# Patient Record
Sex: Female | Born: 1945 | Race: White | Hispanic: No | Marital: Married
Health system: Southern US, Community
[De-identification: ages and names within clinical notes are randomized; demographics above are authoritative.]

---

## 2000-12-13 ENCOUNTER — Encounter: Admission: RE | Admit: 2000-12-13 | Discharge: 2000-12-13 | Payer: Self-pay | Admitting: *Deleted

## 2000-12-13 ENCOUNTER — Encounter: Payer: Self-pay | Admitting: *Deleted

## 2000-12-25 ENCOUNTER — Encounter (INDEPENDENT_AMBULATORY_CARE_PROVIDER_SITE_OTHER): Payer: Self-pay

## 2000-12-25 ENCOUNTER — Other Ambulatory Visit: Admission: RE | Admit: 2000-12-25 | Discharge: 2000-12-25 | Payer: Self-pay | Admitting: Internal Medicine

## 2004-08-27 ENCOUNTER — Ambulatory Visit: Payer: Self-pay | Admitting: Family Medicine

## 2005-08-01 ENCOUNTER — Ambulatory Visit: Payer: Self-pay | Admitting: Family Medicine

## 2006-07-19 ENCOUNTER — Ambulatory Visit: Payer: Self-pay | Admitting: Internal Medicine

## 2006-08-08 ENCOUNTER — Ambulatory Visit: Payer: Self-pay | Admitting: Internal Medicine

## 2006-09-08 ENCOUNTER — Ambulatory Visit: Payer: Self-pay | Admitting: Internal Medicine

## 2006-09-08 LAB — CONVERTED CEMR LAB
ALT: 20 units/L (ref 0–40)
AST: 18 units/L (ref 0–37)
Albumin: 3.5 g/dL (ref 3.5–5.2)
Alkaline Phosphatase: 69 units/L (ref 39–117)
Chloride: 108 meq/L (ref 96–112)
Glomerular Filtration Rate, Af Am: 95 mL/min/{1.73_m2}
Glucose, Bld: 93 mg/dL (ref 70–99)
LDL Cholesterol: 147 mg/dL — ABNORMAL HIGH (ref 0–99)
MCV: 93 fL (ref 78.0–100.0)
Potassium: 4.5 meq/L (ref 3.5–5.1)
RDW: 11.4 % — ABNORMAL LOW (ref 11.5–14.6)
Sodium: 141 meq/L (ref 135–145)
Total Bilirubin: 0.8 mg/dL (ref 0.3–1.2)
Total Protein: 6.2 g/dL (ref 6.0–8.3)
Triglyceride fasting, serum: 68 mg/dL (ref 0–149)
VLDL: 14 mg/dL (ref 0–40)
WBC: 3.8 10*3/uL — ABNORMAL LOW (ref 4.5–10.5)

## 2006-12-08 ENCOUNTER — Ambulatory Visit (HOSPITAL_BASED_OUTPATIENT_CLINIC_OR_DEPARTMENT_OTHER): Admission: RE | Admit: 2006-12-08 | Discharge: 2006-12-08 | Payer: Self-pay | Admitting: General Surgery

## 2007-08-07 ENCOUNTER — Ambulatory Visit: Payer: Self-pay | Admitting: Internal Medicine

## 2007-08-07 DIAGNOSIS — M949 Disorder of cartilage, unspecified: Secondary | ICD-10-CM

## 2007-08-07 DIAGNOSIS — M899 Disorder of bone, unspecified: Secondary | ICD-10-CM | POA: Insufficient documentation

## 2007-08-23 ENCOUNTER — Telehealth (INDEPENDENT_AMBULATORY_CARE_PROVIDER_SITE_OTHER): Payer: Self-pay | Admitting: *Deleted

## 2007-09-21 ENCOUNTER — Ambulatory Visit: Payer: Self-pay | Admitting: Internal Medicine

## 2007-09-24 LAB — CONVERTED CEMR LAB
ALT: 26 units/L (ref 0–35)
Albumin: 3.6 g/dL (ref 3.5–5.2)
Alkaline Phosphatase: 70 units/L (ref 39–117)
Total Protein: 6.5 g/dL (ref 6.0–8.3)

## 2008-02-10 ENCOUNTER — Emergency Department (HOSPITAL_COMMUNITY): Admission: EM | Admit: 2008-02-10 | Discharge: 2008-02-11 | Payer: Self-pay | Admitting: Emergency Medicine

## 2008-02-11 ENCOUNTER — Telehealth (INDEPENDENT_AMBULATORY_CARE_PROVIDER_SITE_OTHER): Payer: Self-pay | Admitting: *Deleted

## 2008-02-12 ENCOUNTER — Ambulatory Visit: Payer: Self-pay | Admitting: Internal Medicine

## 2008-02-12 DIAGNOSIS — R55 Syncope and collapse: Secondary | ICD-10-CM | POA: Insufficient documentation

## 2008-02-18 ENCOUNTER — Telehealth (INDEPENDENT_AMBULATORY_CARE_PROVIDER_SITE_OTHER): Payer: Self-pay | Admitting: *Deleted

## 2008-02-21 ENCOUNTER — Encounter: Admission: RE | Admit: 2008-02-21 | Discharge: 2008-02-21 | Payer: Self-pay | Admitting: Internal Medicine

## 2008-02-26 ENCOUNTER — Encounter (INDEPENDENT_AMBULATORY_CARE_PROVIDER_SITE_OTHER): Payer: Self-pay | Admitting: *Deleted

## 2008-04-01 ENCOUNTER — Ambulatory Visit: Payer: Self-pay | Admitting: Internal Medicine

## 2008-04-07 ENCOUNTER — Encounter (INDEPENDENT_AMBULATORY_CARE_PROVIDER_SITE_OTHER): Payer: Self-pay | Admitting: *Deleted

## 2008-04-07 LAB — CONVERTED CEMR LAB
ALT: 22 units/L (ref 0–35)
AST: 19 units/L (ref 0–37)
Albumin: 3.7 g/dL (ref 3.5–5.2)
Basophils Absolute: 0 10*3/uL (ref 0.0–0.1)
Basophils Relative: 0.3 % (ref 0.0–1.0)
Eosinophils Absolute: 0 10*3/uL (ref 0.0–0.7)
Eosinophils Relative: 0.8 % (ref 0.0–5.0)
HCT: 39.8 % (ref 36.0–46.0)
MCHC: 34.5 g/dL (ref 30.0–36.0)
MCV: 92.4 fL (ref 78.0–100.0)
Monocytes Absolute: 0.4 10*3/uL (ref 0.1–1.0)
Neutro Abs: 2.9 10*3/uL (ref 1.4–7.7)
Neutrophils Relative %: 65.6 % (ref 43.0–77.0)
RBC: 4.31 M/uL (ref 3.87–5.11)
Sed Rate: 10 mm/hr (ref 0–22)
TSH: 2.45 microintl units/mL (ref 0.35–5.50)
Total Protein: 6.7 g/dL (ref 6.0–8.3)
WBC: 4.4 10*3/uL — ABNORMAL LOW (ref 4.5–10.5)

## 2008-08-12 ENCOUNTER — Ambulatory Visit: Payer: Self-pay | Admitting: Family Medicine

## 2008-12-09 ENCOUNTER — Encounter (INDEPENDENT_AMBULATORY_CARE_PROVIDER_SITE_OTHER): Payer: Self-pay | Admitting: *Deleted

## 2008-12-26 ENCOUNTER — Ambulatory Visit: Payer: Self-pay | Admitting: Internal Medicine

## 2008-12-26 DIAGNOSIS — J309 Allergic rhinitis, unspecified: Secondary | ICD-10-CM | POA: Insufficient documentation

## 2008-12-29 ENCOUNTER — Encounter (INDEPENDENT_AMBULATORY_CARE_PROVIDER_SITE_OTHER): Payer: Self-pay | Admitting: *Deleted

## 2009-01-14 ENCOUNTER — Ambulatory Visit: Payer: Self-pay | Admitting: Internal Medicine

## 2009-01-29 ENCOUNTER — Telehealth: Payer: Self-pay | Admitting: Internal Medicine

## 2009-01-30 ENCOUNTER — Ambulatory Visit: Payer: Self-pay | Admitting: Internal Medicine

## 2009-03-04 ENCOUNTER — Ambulatory Visit: Payer: Self-pay | Admitting: Internal Medicine

## 2009-03-13 ENCOUNTER — Encounter: Payer: Self-pay | Admitting: Internal Medicine

## 2009-04-10 ENCOUNTER — Telehealth (INDEPENDENT_AMBULATORY_CARE_PROVIDER_SITE_OTHER): Payer: Self-pay | Admitting: *Deleted

## 2009-09-11 ENCOUNTER — Ambulatory Visit: Payer: Self-pay | Admitting: Internal Medicine

## 2009-09-16 LAB — CONVERTED CEMR LAB
ALT: 20 units/L (ref 0–35)
BUN: 14 mg/dL (ref 6–23)
CO2: 29 meq/L (ref 19–32)
Chloride: 106 meq/L (ref 96–112)
Cholesterol: 217 mg/dL — ABNORMAL HIGH (ref 0–200)
Creatinine, Ser: 0.8 mg/dL (ref 0.4–1.2)
Eosinophils Absolute: 0 10*3/uL (ref 0.0–0.7)
Eosinophils Relative: 1.1 % (ref 0.0–5.0)
Glucose, Bld: 92 mg/dL (ref 70–99)
HCT: 41.5 % (ref 36.0–46.0)
Lymphs Abs: 1.1 10*3/uL (ref 0.7–4.0)
MCHC: 33 g/dL (ref 30.0–36.0)
MCV: 95.5 fL (ref 78.0–100.0)
Monocytes Absolute: 0.3 10*3/uL (ref 0.1–1.0)
Platelets: 216 10*3/uL (ref 150.0–400.0)
Potassium: 4.1 meq/L (ref 3.5–5.1)
TSH: 2.32 microintl units/mL (ref 0.35–5.50)
Total CHOL/HDL Ratio: 5
WBC: 3.8 10*3/uL — ABNORMAL LOW (ref 4.5–10.5)

## 2009-11-23 ENCOUNTER — Telehealth (INDEPENDENT_AMBULATORY_CARE_PROVIDER_SITE_OTHER): Payer: Self-pay | Admitting: *Deleted

## 2009-12-21 ENCOUNTER — Ambulatory Visit: Payer: Self-pay | Admitting: Internal Medicine

## 2009-12-21 DIAGNOSIS — F411 Generalized anxiety disorder: Secondary | ICD-10-CM | POA: Insufficient documentation

## 2010-09-07 ENCOUNTER — Telehealth: Payer: Self-pay | Admitting: Internal Medicine

## 2010-10-31 LAB — CONVERTED CEMR LAB
AST: 30 units/L (ref 0–37)
BUN: 12 mg/dL (ref 6–23)
Basophils Relative: 0.8 % (ref 0.0–1.0)
CO2: 29 meq/L (ref 19–32)
Creatinine, Ser: 0.8 mg/dL (ref 0.4–1.2)
Direct LDL: 145.7 mg/dL
Eosinophils Relative: 2.1 % (ref 0.0–5.0)
GFR calc Af Amer: 95 mL/min
Glucose, Bld: 95 mg/dL (ref 70–99)
HCT: 40 % (ref 36.0–46.0)
Hemoglobin: 13.8 g/dL (ref 12.0–15.0)
Lymphocytes Relative: 30.4 % (ref 12.0–46.0)
Monocytes Absolute: 0.2 10*3/uL (ref 0.2–0.7)
Neutro Abs: 1.9 10*3/uL (ref 1.4–7.7)
Potassium: 4 meq/L (ref 3.5–5.1)
RDW: 11.6 % (ref 11.5–14.6)
Total CHOL/HDL Ratio: 6
VLDL: 13 mg/dL (ref 0–40)
WBC: 3.2 10*3/uL — ABNORMAL LOW (ref 4.5–10.5)

## 2010-11-02 NOTE — Progress Notes (Signed)
Summary: Shingles Vaccine Rx  Phone Note Call from Patient Call back at Home Phone [phone]   Caller: Patient Summary of Call: Patient's wife called and LM on triage VM stating that their insurance will cover the shingles vaccine fully if they have it done at a pharmacy. She would like a written rx for the shingles vaccine to pick up on monday so they can go have it done. Please advise.  Initial call taken by: Harold Barban,  September 07, 2010 1:23 PM  Follow-up for Phone Call        please do Ramsey E. Paz MD  September 07, 2010 4:50 PM   Additional Follow-up for Phone Call Additional follow up Details #1::        placed up front for pt.  Additional Follow-up by: Army Fossa CMA,  September 07, 2010 4:59 PM    New/Updated Medications: * SHINGLES VACCINE please give pt 1 dose of Zostavax subcutaneously. Prescriptions: SHINGLES VACCINE please give pt 1 dose of Zostavax subcutaneously.  #1 x 0   Entered by:   Army Fossa CMA   Authorized by:   Nolon Rod. Paz MD   Signed by:   Army Fossa CMA on 09/07/2010   Method used:   Print then Give to Patient   RxID:   [id number]

## 2010-11-02 NOTE — Progress Notes (Signed)
Summary: Shingles vacc  Phone Note Call from Patient Call back at Home Phone [phone]   Summary of Call: Patient left message on triage noting that she is 75 and has been told by friends that they received the shingle vaccine at 65 yrs old. She would like to know if she could have hers. Initial call taken by: Lucious Groves CMA,  September 07, 2010 10:36 AM  Follow-up for Phone Call        Patient notfiied to check with her insurance and call back for appt. Follow-up by: Lucious Groves CMA,  September 07, 2010 11:31 AM

## 2010-11-02 NOTE — Progress Notes (Signed)
Summary:  antidepressant rx left msg for pt to call  Phone Note Call from Patient Call back at Home Phone [phone]   Caller: Patient Summary of Call: pt called c/o feeling "blue " very mild depression,  husband had major surgery, aortic anurism . Finds herself crying  at times, denies any  suicide ideas "Just cant get myself over the hump" Pt was  seen 09/11/10 for CPX,  wants to know if Dr Drue Novel could prescribe a mild antidepressant  Send to [facility] [address]   Pt would like  call when done.  Kandice Hams  November 23, 2009 3:13 PM  Initial call taken by: Kandice Hams,  November 23, 2009 3:25 PM  Follow-up for Phone Call        recommend to start Zoloft 50 mg half tablet daily for 10 days then one tablet daily call #30, no RF office visit in 4 weeks call if severe side effects: Nausea,  diarrhea. Also call if she has any suicidal ideas  Follow-up by: Jose E. Paz MD,  November 24, 2009 1:53 PM  Additional Follow-up for Phone Call Additional follow up Details #1::        Rx faxed ,  spoke with pt and ov has een scheduled for 1 month folllowup .Kandice Hams  November 24, 2009 2:28 PM   Additional Follow-up by: Kandice Hams,  November 24, 2009 2:28 PM    New/Updated Medications: ZOLOFT 50 MG TABS (SERTRALINE HCL) 1/2 tab  daily for 10 days, then 1 tablet daily Prescriptions: ZOLOFT 50 MG TABS (SERTRALINE HCL) 1/2 tab  daily for 10 days, then 1 tablet daily  #30 x 0   Entered by:   Kandice Hams   Authorized by:   Nolon Rod. Paz MD   Signed by:   Kandice Hams on 11/24/2009   Method used:   Faxed to ...       [facility] [address] [id number] (retail)       [address]       [place], [place]  [address]       Ph: [phone]       Fax: [phone]   RxID:   [id number]

## 2010-11-02 NOTE — Assessment & Plan Note (Signed)
Summary: 1 month followup on meds/alr   Vital Signs:  Patient profile:   65 year old female Weight:      177 pounds Pulse rate:   68 / minute BP sitting:   100 / 74  (left arm)  Vitals Entered By: Doristine Devoid (December 21, 2009 3:53 PM) CC: f/u on meds    History of Present Illness:  patient was recently started on Zoloft due to anxiety. anxiety  was due to her husband  health,  she also had an atypical mole that required a wide excision. several  other  things are going on  on  her life, thinking about retiring soon she responded so well to  25 mg of zoloft  that she decided to stay in that dose instead of going to 50 mg daily   Allergies: 1)  ! Pcn 2)  ! 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 breast Bx (-), in her 58s  Social History: Reviewed history from 09/11/2009 and no changes required. 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   Psych:  Oriented X3, memory intact for recent and remote, normally interactive, and good eye contact.  seems to be doing very well and emotionally    Impression & Recommendations:  Problem # 1:  ANXIETY (ICD-300.00) was started Zoloft due to anxiety, doing very well, see HPI and review of systems plan : Follow-up in 3 months, reassess need of cont. w/   medication but at the same time ok to go  up on Zoloft if  needed  Her updated medication list for this problem includes:    Zoloft 50 Mg Tabs (Sertraline hcl) .Marland Kitchen... 1/2 tab  daily  Problem # 2:  ALLERGIC RHINITIS (ICD-477.9) on Zyrtec p.r.n., okay to take with Zoloft Her updated medication list for this problem includes:    Zyrtec Allergy 10 Mg Tabs (Cetirizine hcl) .Marland Kitchen... 1 otc tablet daily as needed  Complete Medication List: 1)  Calcium and Vit D  2)  Mvi  3)  Biotin  4)  Zyrtec Allergy 10 Mg Tabs (Cetirizine hcl) .Marland Kitchen.. 1 otc tablet daily as needed 5)  Aspirin 81 Mg Tbec (Aspirin) .Marland Kitchen.. 1 a day 6)  Zoloft 50 Mg Tabs (Sertraline hcl) .... 1/2 tab  daily  Patient Instructions: 1)  Please schedule a follow-up appointment in 3 months .  Prescriptions: ZOLOFT 50 MG TABS (SERTRALINE HCL) 1/2 tab  daily  #30 x 2   Entered and Authorized by:   Leslie Rod. Corda Shutt MD   Signed by:   Leslie Rod. Etienne Mowers MD on 12/21/2009   Method used:   Print then Give to Patient   RxID:   [id number]

## 2011-01-17 ENCOUNTER — Telehealth: Payer: Self-pay | Admitting: Internal Medicine

## 2011-01-17 NOTE — Telephone Encounter (Signed)
Patient had last CPX on 09/2009 and last office visit on 12/2009  Will be coming in with husband for his appt with Dr Drue Novel on 4/30 at 9/15----would like to have her lab work for her CPX on 4/30 as well and then she can make appt for CPX when she has her calendar available.   Please let me know and I can call her back   (If OK--I will need orders and codes please)

## 2011-01-19 NOTE — Telephone Encounter (Signed)
BMP, CBC, AST, ALT, FLP, TSH, vit D----dx V70

## 2011-01-20 NOTE — Telephone Encounter (Signed)
Added labs for 4/30

## 2011-01-31 ENCOUNTER — Other Ambulatory Visit (INDEPENDENT_AMBULATORY_CARE_PROVIDER_SITE_OTHER): Payer: BC Managed Care – PPO

## 2011-01-31 DIAGNOSIS — Z0000 Encounter for general adult medical examination without abnormal findings: Secondary | ICD-10-CM

## 2011-01-31 DIAGNOSIS — Z13220 Encounter for screening for lipoid disorders: Secondary | ICD-10-CM

## 2011-01-31 LAB — LIPID PANEL
HDL: 41.5 mg/dL (ref >39.00)
Total CHOL/HDL Ratio: 5
Triglycerides: 117 mg/dL (ref 0.0–149.0)
VLDL: 23.4 mg/dL (ref 0.0–40.0)

## 2011-01-31 LAB — CBC WITH DIFFERENTIAL/PLATELET
Basophils Relative: 0.6 % (ref 0.0–3.0)
Eosinophils Relative: 2.2 % (ref 0.0–5.0)
HCT: 39.2 % (ref 36.0–46.0)
Hemoglobin: 13.5 g/dL (ref 12.0–15.0)
Lymphs Abs: 1.2 10*3/uL (ref 0.7–4.0)
Monocytes Relative: 7.5 % (ref 3.0–12.0)
Neutro Abs: 2.3 10*3/uL (ref 1.4–7.7)
WBC: 3.9 10*3/uL — ABNORMAL LOW (ref 4.5–10.5)

## 2011-01-31 LAB — BASIC METABOLIC PANEL
Chloride: 104 mEq/L (ref 96–112)
GFR: 88.54 mL/min (ref >60.00)
Glucose, Bld: 91 mg/dL (ref 70–99)
Potassium: 4.5 mEq/L (ref 3.5–5.1)
Sodium: 139 mEq/L (ref 135–145)

## 2011-01-31 LAB — TSH: TSH: 3.66 u[IU]/mL (ref 0.35–5.50)

## 2011-02-18 NOTE — Op Note (Signed)
Leslie Montes, BELDING             ACCOUNT NO.:  [id number]   MEDICAL RECORD NO.:  [id number]          PATIENT TYPE:  AMB   LOCATION:  [facility]                          FACILITY:  [facility]   PHYSICIAN:  Leonie Man, M.D.   DATE OF BIRTH:  24-Sep-1946   DATE OF PROCEDURE:  12/08/2006  DATE OF DISCHARGE:                               OPERATIVE REPORT   PREOPERATIVE DIAGNOSIS:  Infected epidermoid cyst, right posterolateral  neck.   POSTOPERATIVE DIAGNOSIS:  Infected epidermoid cyst, right posterolateral  neck.   PROCEDURE:  Incision, drainage and debridement of infected epidermoid  cyst of the neck.   SURGEON:  Leonie Man, M.D.   ASSISTANT:  OR tech Clydie Braun).   ANESTHESIA:  Local using 1% lidocaine with epinephrine, 1:100,000.   NOTE:  [name] is a 65 year old lady, who presented  originally with a noninfected epidermoid cyst of the neck.  She was  scheduled for excision today; however, over the past several days, the  cyst has become infected and started draining purulent material.  She  presents now with an inflamed sebaceous cyst.  We have chosen not to  excise this at this time, but to I&D it and debride the capsule, if  possible.  The patient comes to the operating room understanding this  and gives her consent.   DESCRIPTION OF PROCEDURE:  The patient was positioned on the operating  room table in the supine position and the head and neck turned to the  left.  The area around the sebaceous cyst was prepped and draped to be  included in the sterile operative field, and the entire sebaceous cyst  was infiltrated with 1% lidocaine with epinephrine.  I made an  elliptical incision into the center of the cyst and debrided the as much  of the capsule as was possible down to a clean granulating surface.  This was then packed with a 1/2-inch gauze packing and a sterile  dressing was applied.  The anesthetic was reversed.  The patient was  then removed from the  operating room to the PACU in stable condition.  She will be a prescription for ciprofloxacin 500 mg b.i.d. and Extra  Strength Tylenol for pain.  We will follow up with her in 1 week.      Leonie Man, M.D.  Electronically Signed     PB/MEDQ  D:  12/08/2006  T:  12/08/2006  Job:  [id number]

## 2011-04-30 IMAGING — CR DG ANKLE COMPLETE 3+V*L*
3 series · 3 of 3 positions shown · non-contrast
Comparison: None

CLINICAL DATA: Twisted.  Pain and swelling.

LEFT ANKLE COMPLETE - 3+ VIEW

[view not recorded (1 of 3)]
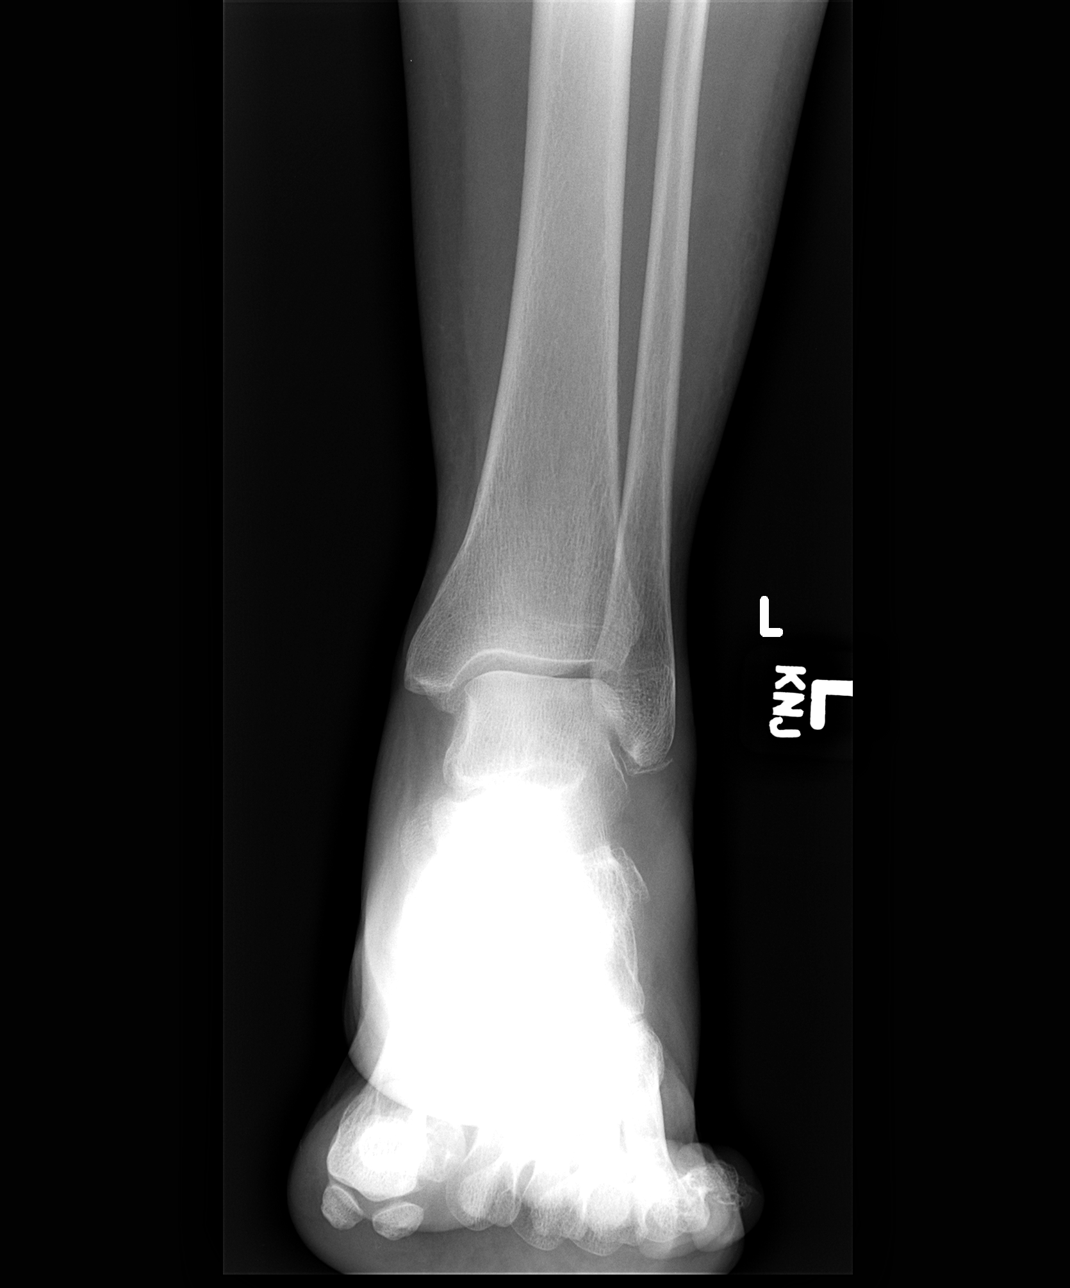

[view not recorded (2 of 3)]
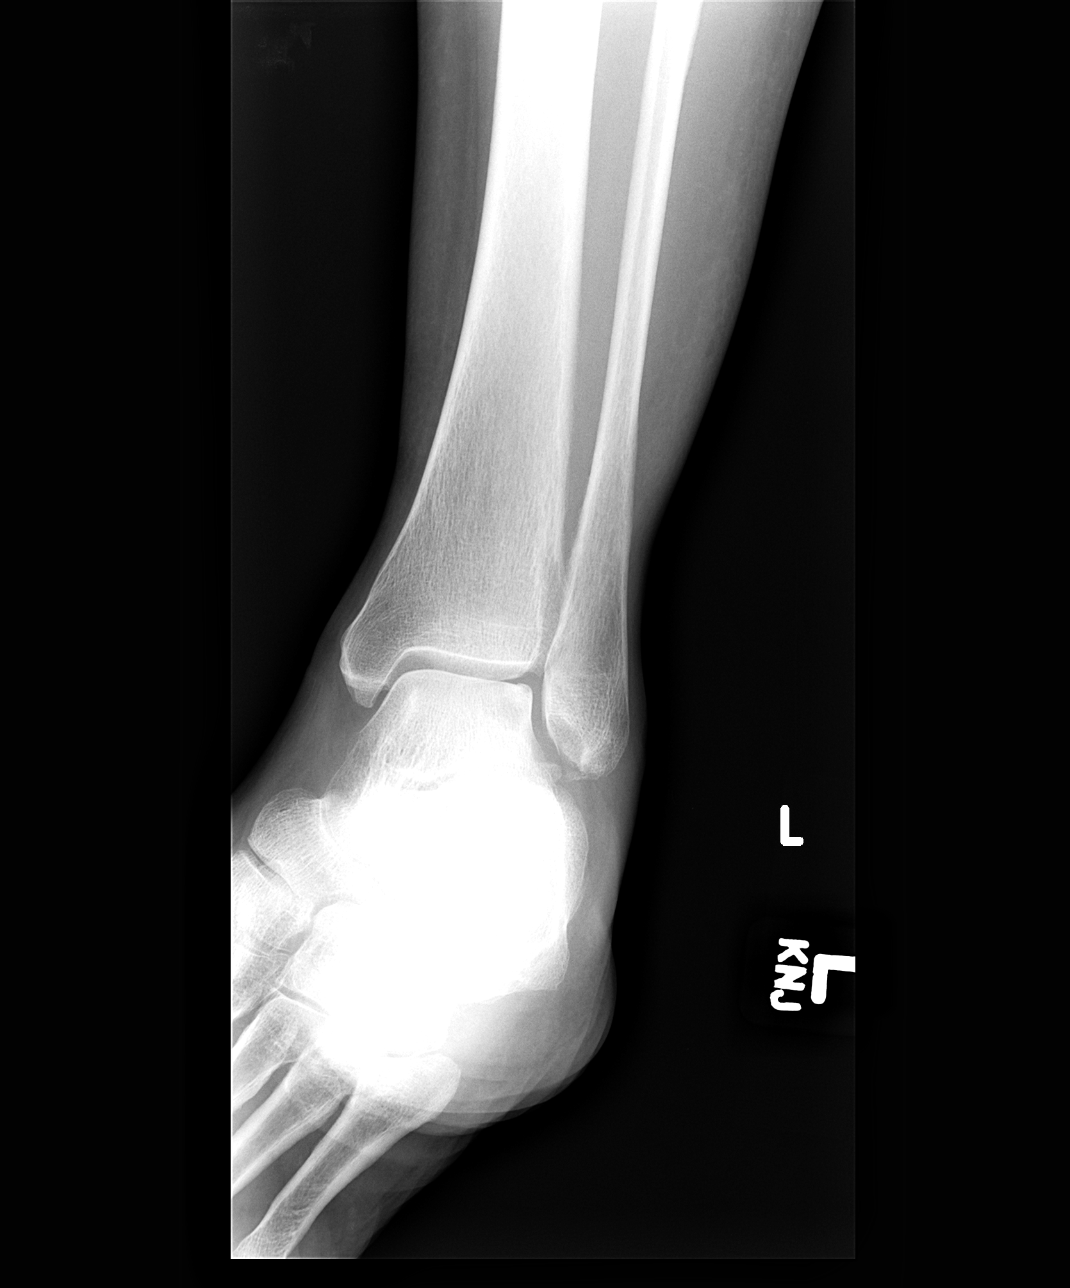

[view not recorded (3 of 3)]
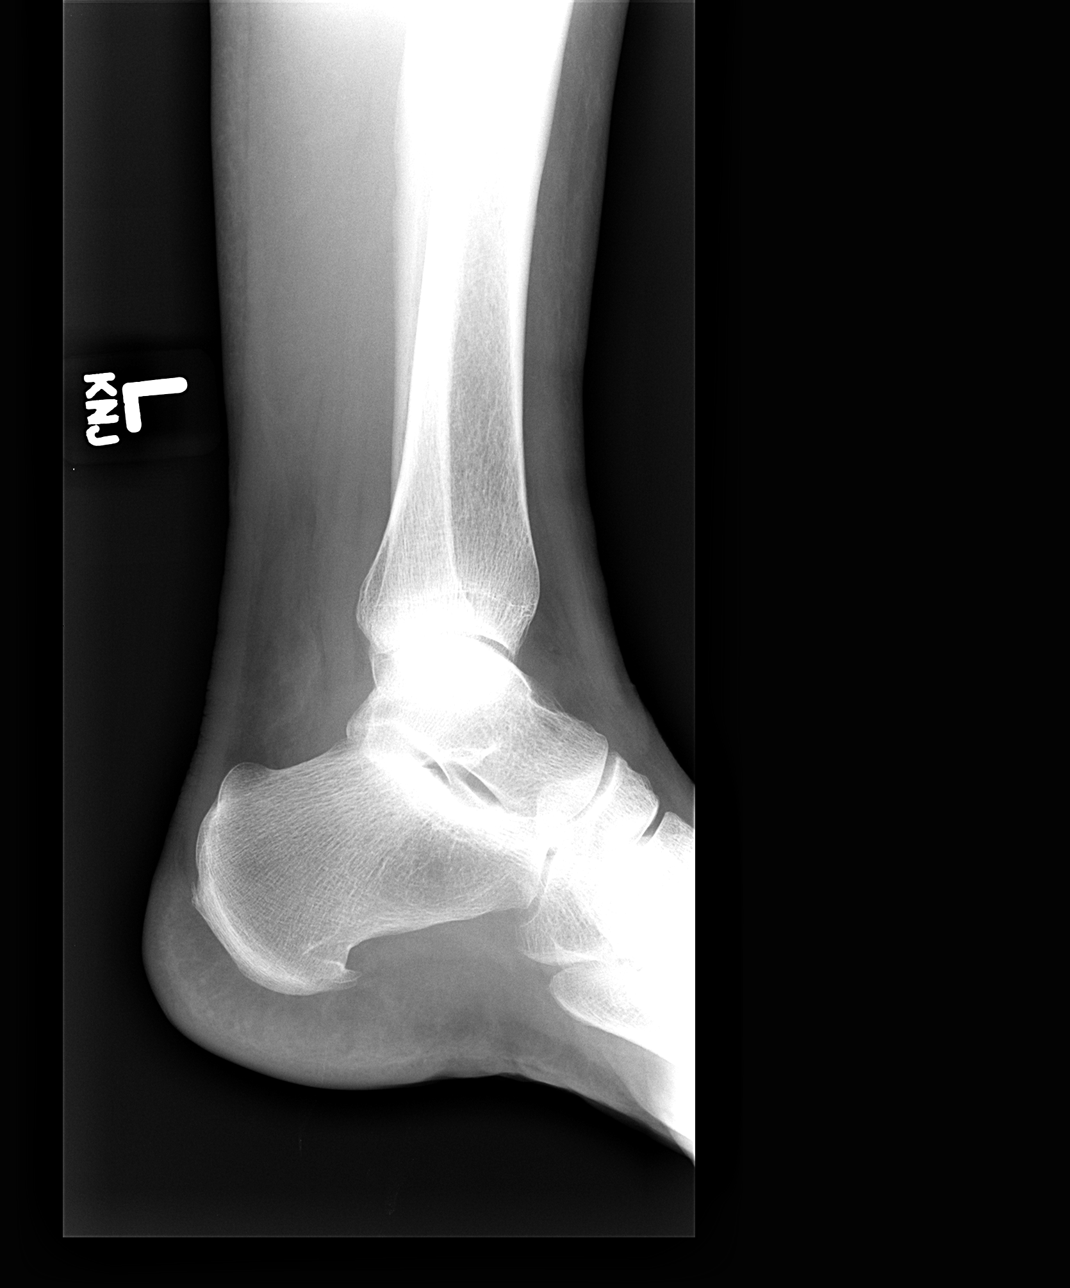

[3 of 3 positions shown; findings below may reference images not displayed]

FINDINGS: There is a small air bubbles of fracture at the tip of
the fibula.  There is lateral soft tissue swelling.  There is
slight irregularity of the lateral aspect of the talar dome.  The
nature and significance of this are not clear.  This could possibly
represent a small talar fracture.  It could be a small degenerative
cyst or focus of avascular necrosis.  No other fractures seen.
Small plantar calcaneal spur.
IMPRESSION: The avulsion fracture of the tip of the fibula.

Small irregularity of the lateral talar dome.  This could be a
small fracture, cyst or focus of avascular necrosis.

## 2014-01-09 ENCOUNTER — Encounter: Payer: Self-pay | Admitting: Internal Medicine

## 2014-04-03 ENCOUNTER — Telehealth: Payer: Self-pay | Admitting: Internal Medicine

## 2014-04-03 NOTE — Telephone Encounter (Signed)
Recall [facility]inder [facility]

## 2015-01-29 ENCOUNTER — Encounter: Payer: Self-pay | Admitting: Internal Medicine
